# Patient Record
Sex: Male | Born: 1960 | Race: White | Hispanic: No | Marital: Single | Smoking: Former smoker
Health system: Southern US, Community
[De-identification: ages and names within clinical notes are randomized; demographics above are authoritative.]

## PROBLEM LIST (undated history)

## (undated) DIAGNOSIS — I219 Acute myocardial infarction, unspecified: Secondary | ICD-10-CM

## (undated) HISTORY — PX: OTHER SURGICAL HISTORY: SHX169

## (undated) HISTORY — PX: HERNIA REPAIR: SHX51

---

## 2006-06-07 ENCOUNTER — Ambulatory Visit: Payer: Self-pay | Admitting: Cardiology

## 2008-07-16 ENCOUNTER — Encounter (HOSPITAL_COMMUNITY): Admission: RE | Admit: 2008-07-16 | Discharge: 2008-07-29 | Payer: Self-pay | Admitting: Endocrinology

## 2008-07-16 ENCOUNTER — Ambulatory Visit (HOSPITAL_COMMUNITY): Payer: Self-pay | Admitting: Endocrinology

## 2008-10-29 ENCOUNTER — Ambulatory Visit (HOSPITAL_COMMUNITY): Admission: RE | Admit: 2008-10-29 | Discharge: 2008-10-29 | Payer: Self-pay | Admitting: Urology

## 2010-02-12 IMAGING — RF DG RETROGRADE PYELOGRAM
1 series · 6 of 6 positions shown · non-contrast
Comparison: None

CLINICAL DATA: Left ureteral calculus

RETROGRADE PYELOGRAM
Fluoro time:  50 seconds

[Series 1: run · 3 acquisitions, 6 frames shown]
[im 1/3]
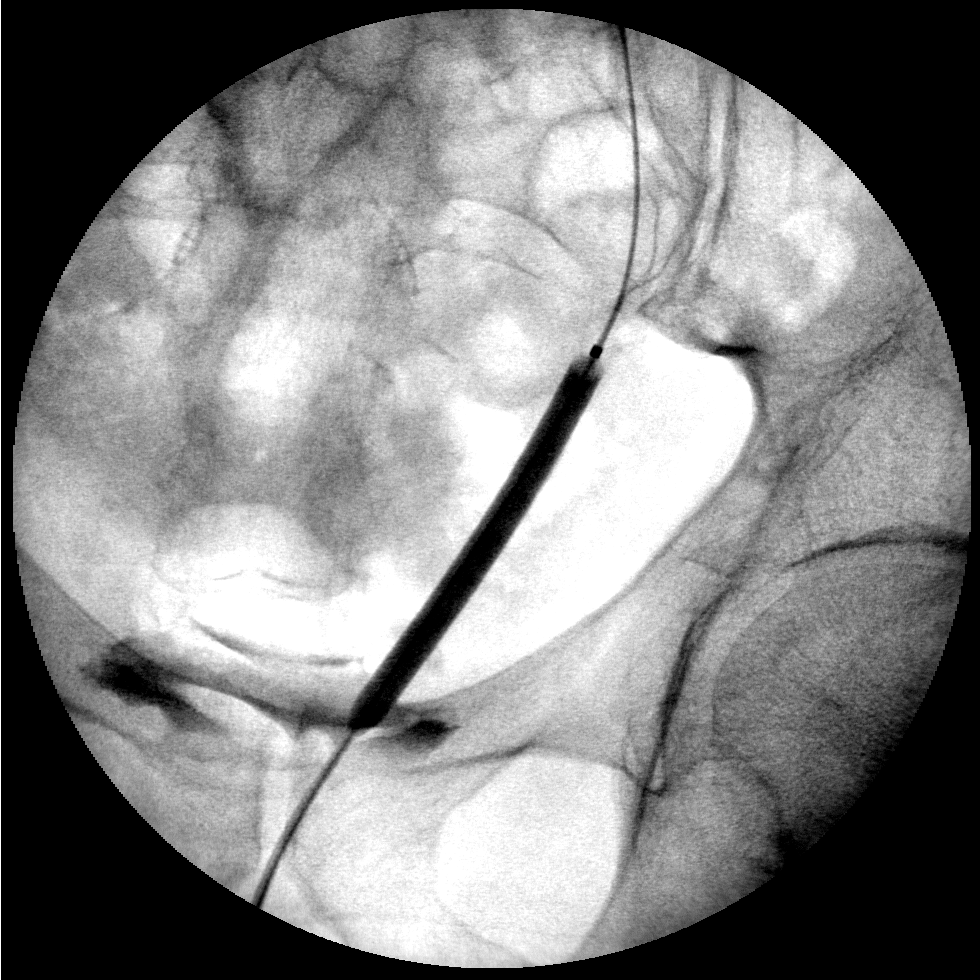
[im 1/3]
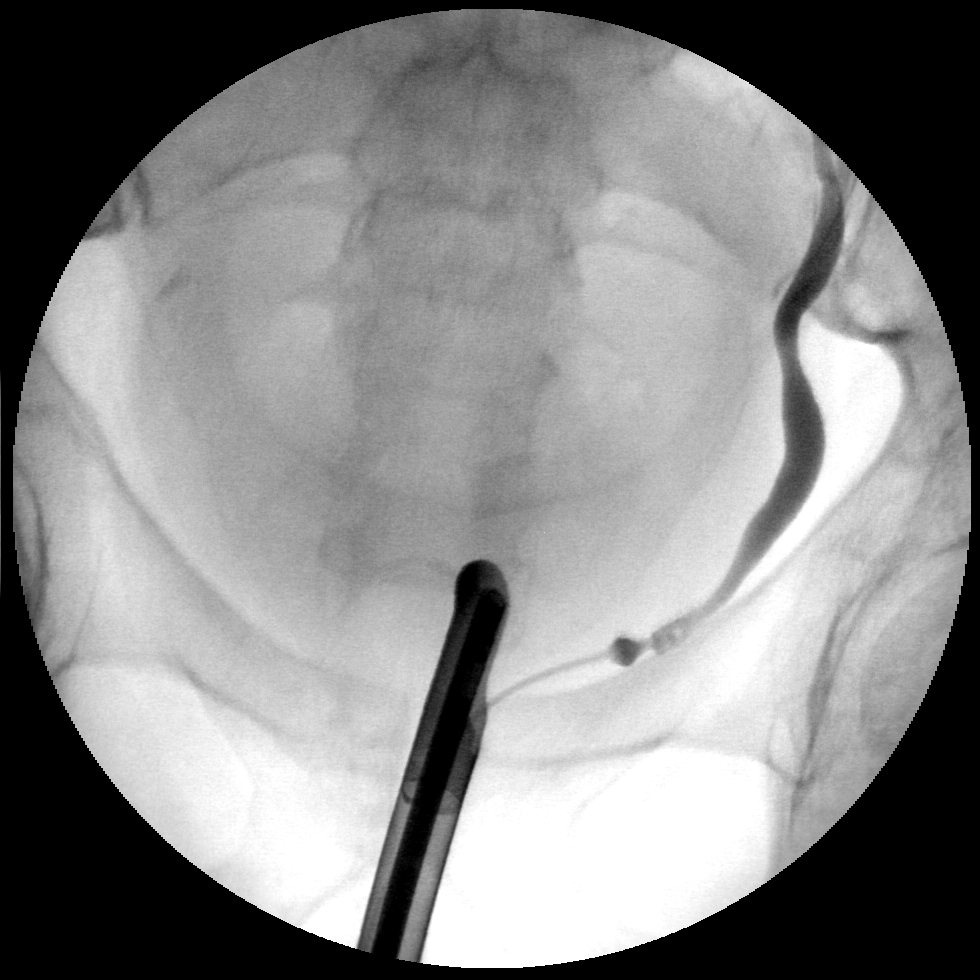
[im 1/3]
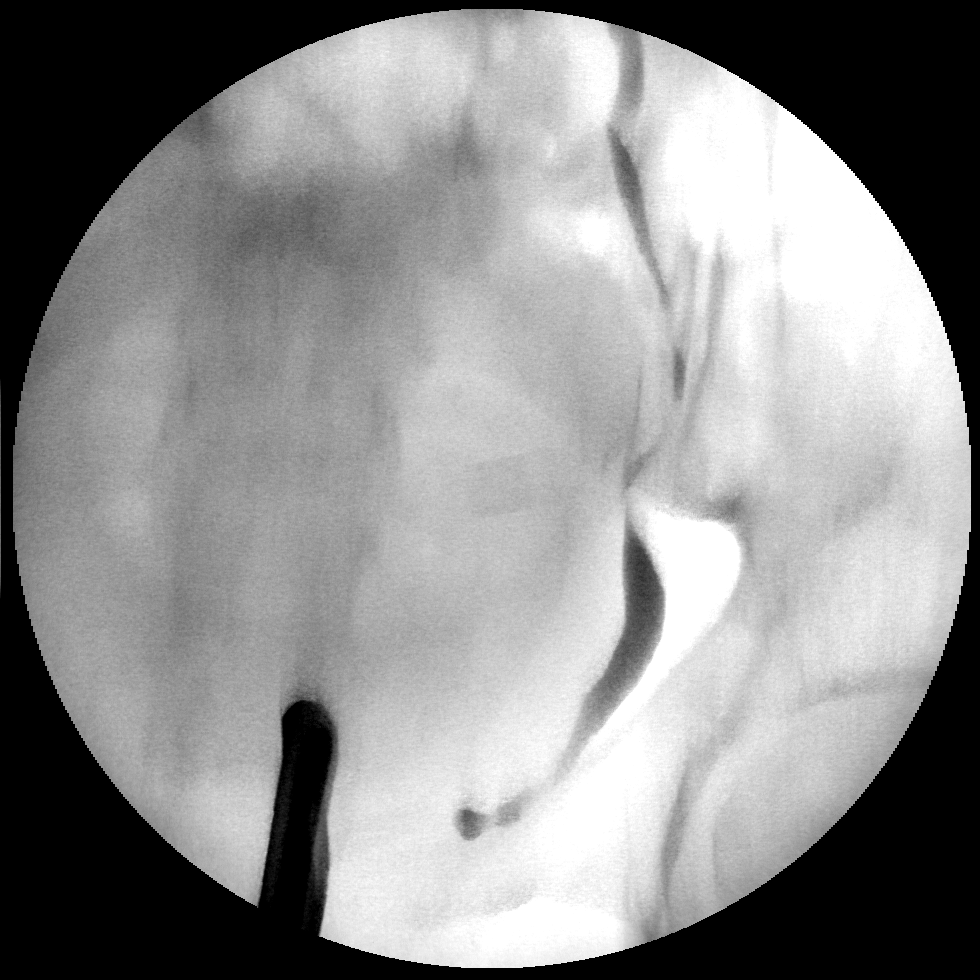
[im 1/3]
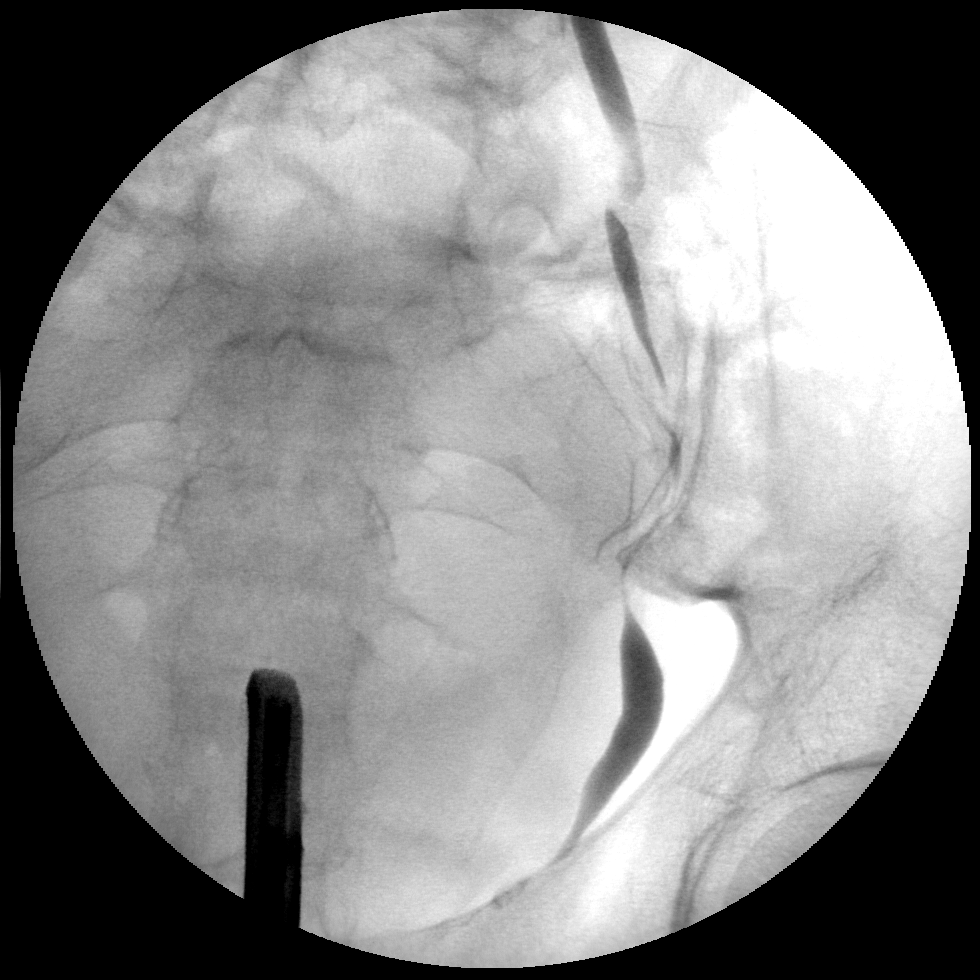
[im 2/3]
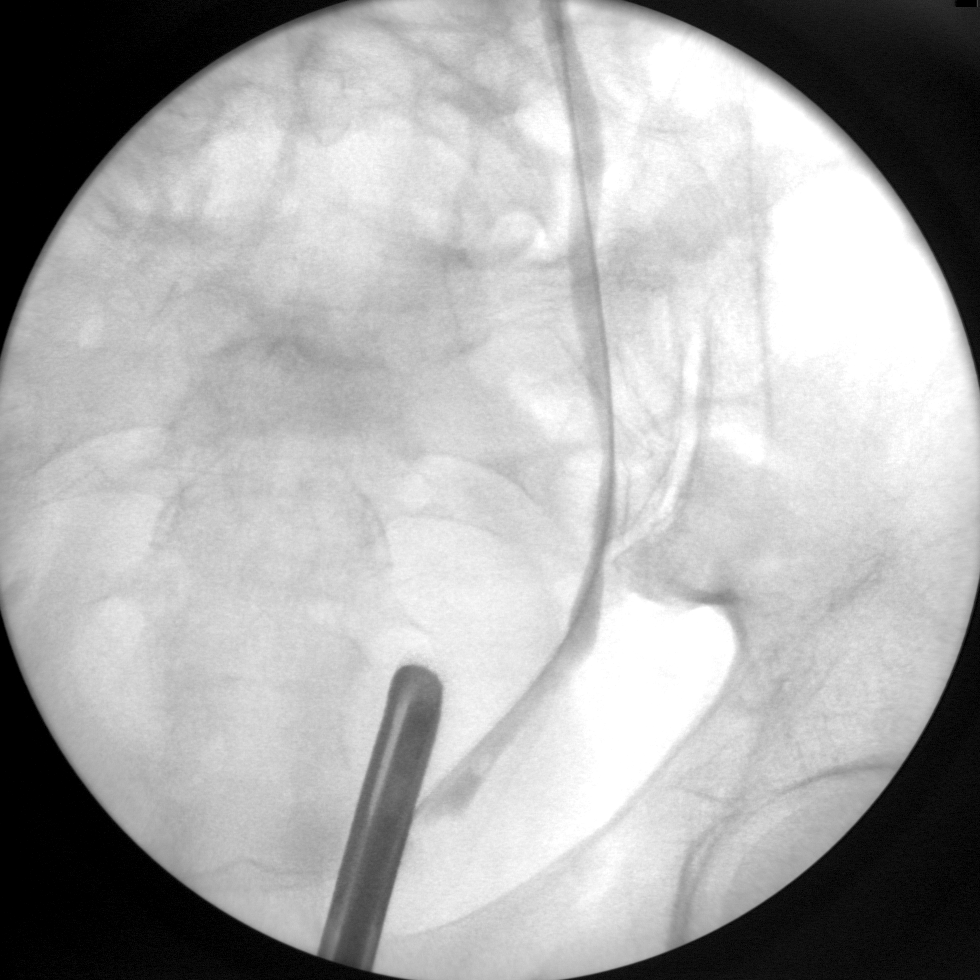
[im 3/3]
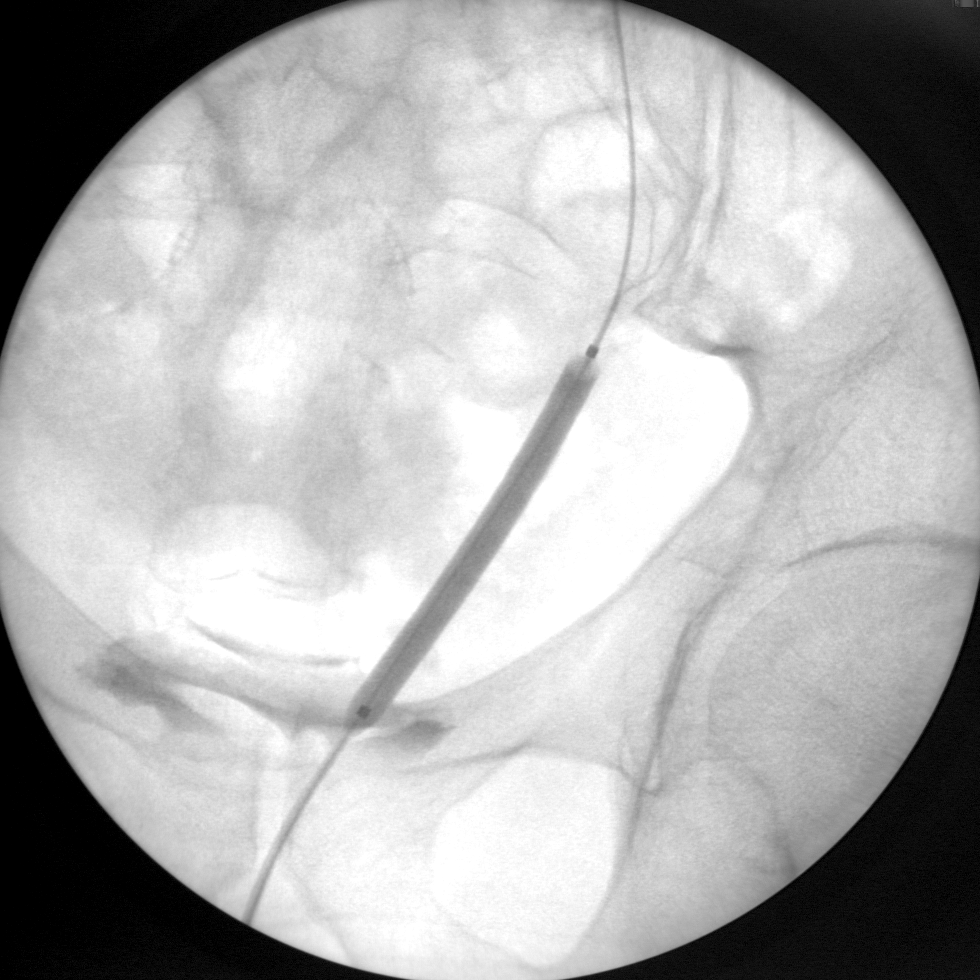

[6 of 6 positions shown; findings below may reference images not displayed]

FINDINGS: Intraoperative imaging was performed.  The procedure was
performed by Dr. Ebadat.  Imaging demonstrates cystoscopic
cannulation of the left ureter.  Injected contrast material shows
irregularity and narrowing of the distal ureter.  Further imaging
shows dilatation of that segment with a balloon.  The collecting
system was not visualized.
IMPRESSION: Intraoperative imaging performed during retrograde ureteral
injection and balloon dilatation of the distal left ureter.

## 2010-11-10 LAB — BASIC METABOLIC PANEL
BUN: 13 mg/dL (ref 6–23)
Chloride: 103 mEq/L (ref 96–112)
Creatinine, Ser: 0.87 mg/dL (ref 0.4–1.5)
GFR calc non Af Amer: >60 mL/min (ref >60)

## 2010-11-10 LAB — HEMOGLOBIN AND HEMATOCRIT, BLOOD: Hemoglobin: 13.9 g/dL (ref 13.0–17.0)

## 2010-12-13 NOTE — Op Note (Signed)
Willie Burns, IIAMS               ACCOUNT NO.:  [id number]   MEDICAL RECORD NO.:  [id number]          PATIENT TYPE:  AMB   LOCATION:  DAY                           FACILITY:  [facility]   PHYSICIAN:  Ky Barban, M.D.DATE OF BIRTH:  11/30/1960   DATE OF PROCEDURE:  DATE OF DISCHARGE:                               OPERATIVE REPORT   PREOPERATIVE DIAGNOSIS:  Left ureteral calculus.   POSTOPERATIVE DIAGNOSIS:  Left ureteral calculus.   PROCEDURE:  Cystoscopy, left retrograde pyelogram, ureteroscopic stone  basket extraction.   ANESTHESIA:  General endotracheal.   PROCEDURE:  The patient under general endotracheal anesthesia in  lithotomy position.  After usual prep and drape, #25 cystoscope was  introduced into the bladder.  The bladder looks normal after ureteral  orifice was catheterized with a Wedge catheter.  Hypaque is injected  under fluoroscopic control.  Dye goes into the lower ureter, which is  dilated and there appears to be a filling defect at ureterovesicular  junction.  The intramural ureter is dilated to #15 and guidewire is  passed up into the upper ureter.  Over the guidewire, short rigid  ureteroscope was introduced.  I can see a stone in the intramural  ureter.  Under direct vision, using his stone basket, the stone is  engaged and removed without any complications.  All of the instruments  were removed and the guidewire is removed, I did not leave any double-J  stent.  The patient left the operating room in satisfactory condition.      Ky Barban, M.D.  Electronically Signed     MIJ/MEDQ  D:  10/29/2008  T:  10/30/2008  Job:  [id number]

## 2010-12-13 NOTE — H&P (Signed)
NAMEJAEDYN, LARD               ACCOUNT NO.:  [id number]   MEDICAL RECORD NO.:  [id number]          PATIENT TYPE:  AMB   LOCATION:  DAY                           FACILITY:  [facility]   PHYSICIAN:  Ky Barban, M.D.DATE OF BIRTH:  08-04-1960   DATE OF ADMISSION:  DATE OF DISCHARGE:  LH                              HISTORY & PHYSICAL   CHIEF COMPLAINT:  Recurrent left renal colic.   HISTORY:  Mr. Beedle was seen by me in the office.  He is 50 years old.  He was seen by me on March 9 with left renal colic.  He had gone to ER  the night before.  He has gone to the ER with left renal colic on March  12 and he was told he has a stone in the left ureterovesical junction, 4  mm in size.  He has no other complaints, so I decided to treat him  conservatively.  He came back again for follow-up.  He said he was doing  fine.  I have put him on Flomax and yesterday, when he came back, he had  had a severe attack.  He wants something done about it, so I offered him  that we can go ahead and do a stone basket.  I described the procedure,  limitations, complications, especially ureteral perforation, leading to  open surgery and stone migration use of double-J stent.  He understands,  wanted me to go ahead and proceed with it.  He is coming in the morning.  We will do a stone basket, holmium laser lithotripsy and probably try to  do as outpatient.   PAST MEDICAL HISTORY:  Negative.   FAMILY HISTORY:  Negative.   ALLERGIES:  None.   MEDICATIONS:  None.   REVIEW OF SYSTEMS:  Unremarkable.   EXAMINATION:  GENERAL:  Well-nourished, well-developed male, not in  acute distress.  VITAL SIGNS:  Blood pressure 127/86, temperature 97.4.  CENTRAL NERVOUS SYSTEM:  Negative.  HEAD AND NECK, ENT:  Negative.  CHEST:  Symmetrical.  HEART:  Regular sinus rhythm, no murmur.  ABDOMEN:  Soft, flat.  Liver, spleen, kidneys are not palpable.  No CVA  tenderness.  EXTERNAL GENITALIA:  Circumcised, meatus  adequate.  Testicles are  normal.  RECTAL EXAM:  Negative.   IMPRESSION:  Left ureteral calculus.   PLAN:  Cystoscopy, left retrograde pyelogram, ureteroscopic stone basket  extraction, holmium laser lithotripsy, possible insertion of double-J  stent under anesthesia as outpatient.      Ky Barban, M.D.  Electronically Signed     MIJ/MEDQ  D:  10/28/2008  T:  10/28/2008  Job:  [id number]   cc:   Lia Hopping  Fax: [phone]

## 2011-05-05 LAB — ACTH STIMULATION, 3 TIME POINTS: Cortisol, 60 Min: 31.3 ug/dL (ref >20)

## 2020-09-06 ENCOUNTER — Ambulatory Visit (HOSPITAL_COMMUNITY): Admit: 2020-09-06 | Payer: Self-pay | Admitting: Cardiovascular Disease

## 2020-09-06 ENCOUNTER — Encounter (HOSPITAL_COMMUNITY): Payer: Self-pay

## 2020-09-06 SURGERY — LEFT HEART CATH AND CORONARY ANGIOGRAPHY
Anesthesia: LOCAL

## 2020-09-19 ENCOUNTER — Encounter (HOSPITAL_COMMUNITY): Payer: Self-pay | Admitting: *Deleted

## 2020-09-19 ENCOUNTER — Other Ambulatory Visit: Payer: Self-pay

## 2020-09-19 ENCOUNTER — Emergency Department (HOSPITAL_COMMUNITY): Payer: BC Managed Care – PPO

## 2020-09-19 ENCOUNTER — Emergency Department (HOSPITAL_COMMUNITY)
Admission: EM | Admit: 2020-09-19 | Discharge: 2020-09-19 | Disposition: A | Discharge status: Home / Self Care | Payer: BC Managed Care – PPO | Attending: Emergency Medicine | Admitting: Emergency Medicine

## 2020-09-19 DIAGNOSIS — R0789 Other chest pain: Secondary | ICD-10-CM | POA: Diagnosis present

## 2020-09-19 DIAGNOSIS — I2510 Atherosclerotic heart disease of native coronary artery without angina pectoris: Secondary | ICD-10-CM | POA: Insufficient documentation

## 2020-09-19 DIAGNOSIS — Z87891 Personal history of nicotine dependence: Secondary | ICD-10-CM | POA: Insufficient documentation

## 2020-09-19 HISTORY — DX: Acute myocardial infarction, unspecified: I21.9

## 2020-09-19 LAB — CBC
HCT: 39.2 % (ref 39.0–52.0)
Hemoglobin: 12.9 g/dL — ABNORMAL LOW (ref 13.0–17.0)
MCH: 29.9 pg (ref 26.0–34.0)
MCHC: 32.9 g/dL (ref 30.0–36.0)
MCV: 90.7 fL (ref 80.0–100.0)
Platelets: 341 10*3/uL (ref 150–400)
RBC: 4.32 MIL/uL (ref 4.22–5.81)
RDW: 12.8 % (ref 11.5–15.5)
WBC: 4.8 10*3/uL (ref 4.0–10.5)
nRBC: 0 % (ref 0.0–0.2)

## 2020-09-19 LAB — BASIC METABOLIC PANEL
Anion gap: 6 (ref 5–15)
BUN: 14 mg/dL (ref 6–20)
CO2: 25 mmol/L (ref 22–32)
Calcium: 9.3 mg/dL (ref 8.9–10.3)
Chloride: 107 mmol/L (ref 98–111)
Creatinine, Ser: 0.88 mg/dL (ref 0.61–1.24)
GFR, Estimated: >60 mL/min (ref >60)
Glucose, Bld: 102 mg/dL — ABNORMAL HIGH (ref 70–99)
Potassium: 4 mmol/L (ref 3.5–5.1)
Sodium: 138 mmol/L (ref 135–145)

## 2020-09-19 LAB — TROPONIN I (HIGH SENSITIVITY)
Troponin I (High Sensitivity): 14 ng/L (ref <18)
Troponin I (High Sensitivity): 13 ng/L (ref <18)

## 2020-09-19 MED ORDER — NITROGLYCERIN 0.4 MG SL SUBL
0.4000 mg | SUBLINGUAL_TABLET | SUBLINGUAL | Status: DC | PRN
  Administered 2020-09-19 (×2): 0.4 mg via SUBLINGUAL
  Filled 2020-09-19: qty 1

## 2020-09-19 NOTE — ED Provider Notes (Signed)
[facility] EMERGENCY DEPARTMENT Provider Note   CSN: [id number] Arrival date & time: 09/19/20  1122     History Chief Complaint  Patient presents with  . Chest Pain    Willie Burns is a 60 y.o. male.  HPI   60 year old male with past medical history of CAD status post MI with cath and PCI x 1 on 09/06/2020 presents the emergency department with a soreness in his chest.  Patient states when he woke up this morning he feels a soreness along his anterior chest.  He denies any sharp pain, heaviness or tightness.  No associated shortness of breath.  He admits that he did just recently start working out, was doing some heavy lifting yesterday.  Some movement does make the discomfort worse.  No recent cough or fever.  No swelling of his lower extremities.  He was placed on aspirin after his recent MI, has an outpatient follow-up with [facility] cardiology in about a week.  The pain does not radiate into his back or abdomen.  Past Medical History:  Diagnosis Date  . MI (myocardial infarction) (HCC)     There are no problems to display for this patient.   Past Surgical History:  Procedure Laterality Date  . HERNIA REPAIR    . stent         History reviewed. No pertinent family history.  Social History   Tobacco Use  . Smoking status: Former Games developer  . Smokeless tobacco: Never Used  Vaping Use  . Vaping Use: Some days  Substance Use Topics  . Alcohol use: Not Currently  . Drug use: Never    Home Medications Prior to Admission medications   Not on File    Allergies    Bee venom and Codeine  Review of Systems   Review of Systems  Constitutional: Negative for chills and fever.  HENT: Negative for congestion.   Eyes: Negative for visual disturbance.  Respiratory: Negative for chest tightness and shortness of breath.   Cardiovascular: Positive for chest pain. Negative for palpitations and leg swelling.  Gastrointestinal: Negative for abdominal pain, diarrhea and vomiting.   Genitourinary: Negative for dysuria.  Musculoskeletal: Negative for back pain.  Skin: Negative for rash.  Neurological: Negative for headaches.    Physical Exam Updated Vital Signs BP 139/88   Pulse 97   Temp 97.7 F (36.5 C) (Oral)   Resp 18   Ht 5\' 6"  (1.676 m)   Wt 58.1 kg   SpO2 100%   BMI 20.66 kg/m   Physical Exam Vitals and nursing note reviewed.  Constitutional:      Appearance: Normal appearance.  HENT:     Head: Normocephalic.     Mouth/Throat:     Mouth: Mucous membranes are moist.  Cardiovascular:     Rate and Rhythm: Normal rate.  Pulmonary:     Effort: Pulmonary effort is normal. No respiratory distress.     Breath sounds: No decreased breath sounds, wheezing or rales.  Chest:     Chest wall: Tenderness present. No crepitus.  Abdominal:     Palpations: Abdomen is soft. There is no mass.     Tenderness: There is no abdominal tenderness. There is no guarding or rebound.  Skin:    General: Skin is warm.  Neurological:     Mental Status: He is alert and oriented to person, place, and time. Mental status is at baseline.  Psychiatric:        Mood and Affect: Mood normal.  ED Results / Procedures / Treatments   Labs (all labs ordered are listed, but only abnormal results are displayed) Labs Reviewed  BASIC METABOLIC PANEL - Abnormal; Notable for the following components:      Result Value   Glucose, Bld 102 (*)    All other components within normal limits  CBC - Abnormal; Notable for the following components:   Hemoglobin 12.9 (*)    All other components within normal limits  TROPONIN I (HIGH SENSITIVITY)    EKG EKG Interpretation  Date/Time:  Sunday September 19 2020 11:40:43 EST Ventricular Rate:  102 PR Interval:    QRS Duration: 104 QT Interval:  357 QTC Calculation: 465 R Axis:   -59 Text Interpretation: Sinus tachycardia Inferoposterior infarct, age indeterminate Sinus tachycardia, T wave inversions in inferior leads, no previous,  no STEMI Confirmed by Janari Gagner ([id number]) on 09/19/2020 11:50:15 AM   Radiology DG Chest Portable 1 View  Result Date: 09/19/2020 CLINICAL DATA:  Chest pain.  Recent MI. EXAM: PORTABLE CHEST 1 VIEW COMPARISON:  September 06, 2020 FINDINGS: The heart, hila, mediastinum, lungs, and pleura are unremarkable. Bilateral nipple shadows are noted. No acute abnormalities. IMPRESSION: No active disease. Electronically Signed   By: David  Williams III M.D   On: 09/19/2020 12:22    Procedures Procedures   Medications Ordered in ED Medications  nitroGLYCERIN (NITROSTAT) SL tablet 0.4 mg (has no administration in time range)    ED Course  I have reviewed the triage vital signs and the nursing notes.  Pertinent labs & imaging results that were available during my care of the patient were reviewed by me and considered in my medical decision making (see chart for details).    MDM Rules/Calculators/A&P                          59  year old male presents the emergency department with anterior chest soreness.  Patient recently had MI that was treated with PCI x1 on 09/06/2020 through [facility] health.  He has been placed on aspirin, has a outpatient follow-up appointment in about a week.  He had an uncomplicated course so far.  Patient admits that he recently started working out and doing heavy lifting.  When he woke up this morning he had a soreness in his chest, it is worse with movement and reproducible to an extent.  Not relieved with nitro.  EKG shows normal sinus rhythm with T wave inversions in the inferior leads, no previous to compare to.  Chest x-ray is unremarkable.  Blood work is reassuring with a negative troponin with no delta. [facility] cardiology contacted for consult and call back in regards to patient care.   Spoke with [facility] from [facility] cardiology.  He was able to compare the EKG we have today to an old one, he has the T wave inversions on the previous EKG, this is not an acute change.  He is  reassured with the patient's presentation, reproducible pain and negative cardiac work-up.  At this time we have a low suspicion for ACS or complication from PCI.  On reevaluation pain continues to be motion related and reproducible.  Had a long discussion with the patient in regards to ACS versus musculoskeletal pain.  Given strict return to ED instructions.  He prefers to go home and understands he is to return for any change in symptoms.  He has a follow-up in a week with cardiology.  Again I have low suspicion that  this is ACS or complication of PCI, seems very musculoskeletal, low suspicion for dissection/PE or any other acute process given exam.  Patient will be discharged and treated as an outpatient.  Discharge plan and strict return to ED precautions discussed, patient verbalizes understanding and agreement.  Final Clinical Impression(s) / ED Diagnoses Final diagnoses:  None    Rx / DC Orders ED Discharge Orders    None       Rozelle Logan, DO 09/19/20 1625

## 2020-09-19 NOTE — Discharge Instructions (Addendum)
You have been seen and discharged from the emergency department.  Follow-up with your cardiology appointment and primary provider for reevaluation. Take home medications as prescribed. If you have any worsening symptoms, chest pain, shortness of breath or further concerns for health please return to an emergency department for further evaluation.

## 2020-09-19 NOTE — ED Triage Notes (Signed)
Pt mid chest pressure since this morning.  Recent MI 2 weeks ago with stent placement. Pt denies taking any NTG as well. +lightheadedness. Denies sob

## 2022-01-03 IMAGING — DX DG CHEST 1V PORT
1 series · 1 of 1 positions shown · non-contrast
Comparison: September 06, 2020

CLINICAL DATA: Chest pain.  Recent MI.

EXAM:
PORTABLE CHEST 1 VIEW

[chest ap]
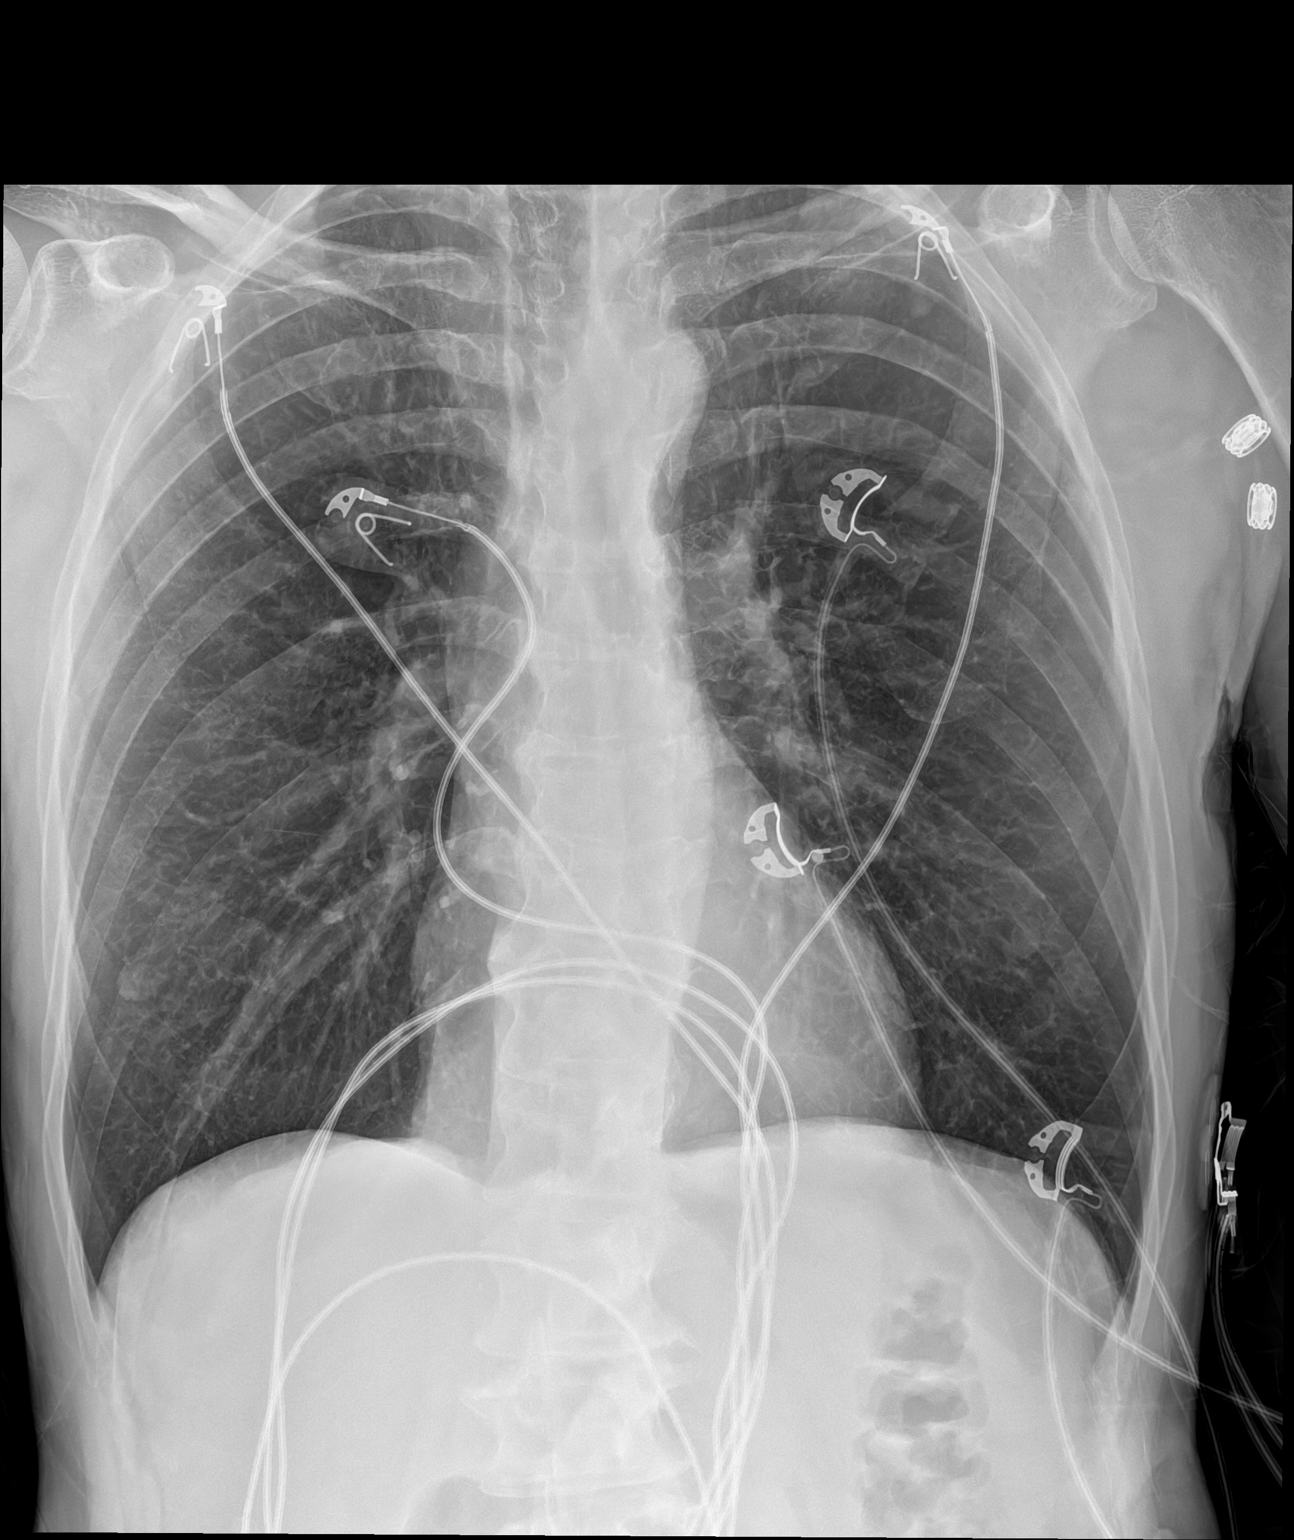

[1 of 1 positions shown; findings below may reference images not displayed]

FINDINGS: The heart, hila, mediastinum, lungs, and pleura are unremarkable.
Bilateral nipple shadows are noted. No acute abnormalities.
IMPRESSION: No active disease.

## 2023-05-10 ENCOUNTER — Ambulatory Visit: Admitting: Family Medicine
